# Patient Record
Sex: Male | Born: 1990 | Race: Black or African American | Hispanic: No | Marital: Single | Smoking: Never smoker
Health system: Southern US, Community
[De-identification: ages and names within clinical notes are randomized; demographics above are authoritative.]

## PROBLEM LIST (undated history)

## (undated) DIAGNOSIS — U071 COVID-19: Secondary | ICD-10-CM

---
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
## 2009-05-16 ENCOUNTER — Emergency Department (HOSPITAL_COMMUNITY): Admission: EM | Admit: 2009-05-16 | Discharge: 2009-05-16 | Payer: Self-pay | Admitting: Emergency Medicine

## 2014-03-30 ENCOUNTER — Encounter (HOSPITAL_COMMUNITY): Payer: Self-pay | Admitting: Emergency Medicine

## 2014-03-30 ENCOUNTER — Emergency Department (HOSPITAL_COMMUNITY): Payer: Self-pay

## 2014-03-30 ENCOUNTER — Emergency Department (HOSPITAL_COMMUNITY)
Admission: EM | Admit: 2014-03-30 | Discharge: 2014-03-30 | Disposition: A | Discharge status: Home / Self Care | Payer: Self-pay | Attending: Emergency Medicine | Admitting: Emergency Medicine

## 2014-03-30 DIAGNOSIS — R42 Dizziness and giddiness: Secondary | ICD-10-CM | POA: Insufficient documentation

## 2014-03-30 DIAGNOSIS — Y92410 Unspecified street and highway as the place of occurrence of the external cause: Secondary | ICD-10-CM | POA: Insufficient documentation

## 2014-03-30 DIAGNOSIS — Y9389 Activity, other specified: Secondary | ICD-10-CM | POA: Insufficient documentation

## 2014-03-30 DIAGNOSIS — S161XXA Strain of muscle, fascia and tendon at neck level, initial encounter: Secondary | ICD-10-CM | POA: Insufficient documentation

## 2014-03-30 MED ORDER — NAPROXEN 500 MG PO TABS: ORAL_TABLET | ORAL | Status: DC

## 2014-03-30 NOTE — ED Notes (Signed)
MD at bedside. 
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
## 2014-03-30 NOTE — Discharge Instructions (Signed)
Follow up with your md as needed °
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
## 2014-05-29 ENCOUNTER — Emergency Department (HOSPITAL_COMMUNITY)
Admission: EM | Admit: 2014-05-29 | Discharge: 2014-05-29 | Disposition: A | Discharge status: Home / Self Care | Payer: BC Managed Care – PPO | Attending: Emergency Medicine | Admitting: Emergency Medicine

## 2014-05-29 ENCOUNTER — Encounter (HOSPITAL_COMMUNITY): Payer: Self-pay | Admitting: Emergency Medicine

## 2014-05-29 ENCOUNTER — Emergency Department (HOSPITAL_COMMUNITY): Payer: BC Managed Care – PPO

## 2014-05-29 DIAGNOSIS — Y998 Other external cause status: Secondary | ICD-10-CM | POA: Insufficient documentation

## 2014-05-29 DIAGNOSIS — Y92410 Unspecified street and highway as the place of occurrence of the external cause: Secondary | ICD-10-CM | POA: Diagnosis not present

## 2014-05-29 DIAGNOSIS — Z791 Long term (current) use of non-steroidal anti-inflammatories (NSAID): Secondary | ICD-10-CM | POA: Diagnosis not present

## 2014-05-29 DIAGNOSIS — S52351A Displaced comminuted fracture of shaft of radius, right arm, initial encounter for closed fracture: Secondary | ICD-10-CM | POA: Insufficient documentation

## 2014-05-29 DIAGNOSIS — S52301A Unspecified fracture of shaft of right radius, initial encounter for closed fracture: Secondary | ICD-10-CM

## 2014-05-29 DIAGNOSIS — S52201A Unspecified fracture of shaft of right ulna, initial encounter for closed fracture: Secondary | ICD-10-CM

## 2014-05-29 DIAGNOSIS — S4991XA Unspecified injury of right shoulder and upper arm, initial encounter: Secondary | ICD-10-CM | POA: Diagnosis present

## 2014-05-29 DIAGNOSIS — S52251A Displaced comminuted fracture of shaft of ulna, right arm, initial encounter for closed fracture: Secondary | ICD-10-CM | POA: Diagnosis not present

## 2014-05-29 DIAGNOSIS — Y9389 Activity, other specified: Secondary | ICD-10-CM | POA: Insufficient documentation

## 2014-05-29 MED ORDER — ONDANSETRON HCL 4 MG/2ML IJ SOLN
4.0000 mg | Freq: Once | INTRAMUSCULAR | Status: AC
  Administered 2014-05-29: 4 mg via INTRAVENOUS
  Filled 2014-05-29: qty 2

## 2014-05-29 MED ORDER — OXYCODONE-ACETAMINOPHEN 5-325 MG PO TABS: 1.0000 | ORAL_TABLET | ORAL | Status: DC | PRN

## 2014-05-29 MED ORDER — HYDROMORPHONE HCL 1 MG/ML IJ SOLN
1.0000 mg | Freq: Once | INTRAMUSCULAR | Status: AC
  Administered 2014-05-29: 1 mg via INTRAVENOUS
  Filled 2014-05-29: qty 1
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
## 2014-05-29 NOTE — Discharge Instructions (Signed)
Forearm Fracture °Your caregiver has diagnosed you as having a broken bone (fracture) of the forearm. This is the part of your arm between the elbow and your wrist. Your forearm is made up of two bones. These are the radius and ulna. A fracture is a break in one or both bones. A cast or splint is used to protect and keep your injured bone from moving. The cast or splint will be on generally for about 5 to 6 weeks, with individual variations. °HOME CARE INSTRUCTIONS  °· Keep the injured part elevated while sitting or lying down. Keeping the injury above the level of your heart (the center of the chest). This will decrease swelling and pain. °· Apply ice to the injury for 15-20 minutes, 03-04 times per day while awake, for 2 days. Put the ice in a plastic bag and place a thin towel between the bag of ice and your cast or splint. °· If you have a plaster or fiberglass cast: °¨ Do not try to scratch the skin under the cast using sharp or pointed objects. °¨ Check the skin around the cast every day. You may put lotion on any red or sore areas. °¨ Keep your cast dry and clean. °· If you have a plaster splint: °¨ Wear the splint as directed. °¨ You may loosen the elastic around the splint if your fingers become numb, tingle, or turn cold or blue. °· Do not put pressure on any part of your cast or splint. It may break. Rest your cast only on a pillow the first 24 hours until it is fully hardened. °· Your cast or splint can be protected during bathing with a plastic bag. Do not lower the cast or splint into water. °· Only take over-the-counter or prescription medicines for pain, discomfort, or fever as directed by your caregiver. °SEEK IMMEDIATE MEDICAL CARE IF:  °· Your cast gets damaged or breaks. °· You have more severe pain or swelling than you did before the cast. °· Your skin or nails below the injury turn blue or gray, or feel cold or numb. °· There is a bad smell or new stains and/or pus like (purulent) drainage  coming from under the cast. °MAKE SURE YOU:  °· Understand these instructions. °· Will watch your condition. °· Will get help right away if you are not doing well or get worse. °Document Released: 06/01/2000 Document Revised: 08/27/2011 Document Reviewed: 01/22/2008 °ExitCare® Patient Information ©2015 [employer]. This information is not intended to replace advice given to you by your health care provider. Make sure you discuss any questions you have with your health care provider. ° °Cast or Splint Care °Casts and splints support injured limbs and keep bones from moving while they heal. It is important to care for your cast or splint at home.   °HOME CARE INSTRUCTIONS °· Keep the cast or splint uncovered during the drying period. It can take 24 to 48 hours to dry if it is made of plaster. A fiberglass cast will dry in less than 1 hour. °· Do not rest the cast on anything harder than a pillow for the first 24 hours. °· Do not put weight on your injured limb or apply pressure to the cast until your health care provider gives you permission. °· Keep the cast or splint dry. Wet casts or splints can lose their shape and may not support the limb as well. A wet cast that has lost its shape can also create harmful pressure on   on your skin when it dries. Also, wet skin can become infected.  Cover the cast or splint with a plastic bag when bathing or when out in the rain or snow. If the cast is on the trunk of the body, take sponge baths until the cast is removed.  If your cast does become wet, dry it with a towel or a blow dryer on the cool setting only.  Keep your cast or splint clean. Soiled casts may be wiped with a moistened cloth.  Do not place any hard or soft foreign objects under your cast or splint, such as cotton, toilet paper, lotion, or powder.  Do not try to scratch the skin under the cast with any object. The object could get stuck inside the cast. Also, scratching could lead to an infection. If  itching is a problem, use a blow dryer on a cool setting to relieve discomfort.  Do not trim or cut your cast or remove padding from inside of it.  Exercise all joints next to the injury that are not immobilized by the cast or splint. For example, if you have a long leg cast, exercise the hip joint and toes. If you have an arm cast or splint, exercise the shoulder, elbow, thumb, and fingers.  Elevate your injured arm or leg on 1 or 2 pillows for the first 1 to 3 days to decrease swelling and pain.It is best if you can comfortably elevate your cast so it is higher than your heart. SEEK MEDICAL CARE IF:   Your cast or splint cracks.  Your cast or splint is too tight or too loose.  You have unbearable itching inside the cast.  Your cast becomes wet or develops a soft spot or area.  You have a bad smell coming from inside your cast.  You get an object stuck under your cast.  Your skin around the cast becomes red or raw.  You have new pain or worsening pain after the cast has been applied. SEEK IMMEDIATE MEDICAL CARE IF:   You have fluid leaking through the cast.  You are unable to move your fingers or toes.  You have discolored (blue or white), cool, painful, or very swollen fingers or toes beyond the cast.  You have tingling or numbness around the injured area.  You have severe pain or pressure under the cast.  You have any difficulty with your breathing or have shortness of breath.  You have chest pain. Document Released: 06/01/2000 Document Revised: 03/25/2013 Document Reviewed: 12/11/2012 [facility] Patient Information 2015 [employer] [place]. This information is not intended to replace advice given to you by your health care provider. Make sure you discuss any questions you have with your health care provider.  Acetaminophen; Oxycodone tablets What is this medicine? ACETAMINOPHEN; OXYCODONE (a set a MEE noe fen; ox i KOE done) is a pain reliever. It is used to treat mild to  moderate pain. This medicine may be used for other purposes; ask your health care provider or pharmacist if you have questions. COMMON BRAND NAME(S): Endocet, Magnacet, Narvox, Percocet, Perloxx, Primalev, Primlev, Roxicet, Xolox What should I tell my health care provider before I take this medicine? They need to know if you have any of these conditions: -brain tumor -Crohn's disease, inflammatory bowel disease, or ulcerative colitis -drug abuse or addiction -head injury -heart or circulation problems -if you often drink alcohol -kidney disease or problems going to the bathroom -liver disease -lung disease, asthma, or breathing problems -an unusual  or allergic reaction to acetaminophen, oxycodone, other opioid analgesics, other medicines, foods, dyes, or preservatives -pregnant or trying to get pregnant -breast-feeding How should I use this medicine? Take this medicine by mouth with a full glass of water. Follow the directions on the prescription label. Take your medicine at regular intervals. Do not take your medicine more often than directed. Talk to your pediatrician regarding the use of this medicine in children. Special care may be needed. Patients over 25 years old may have a stronger reaction and need a smaller dose. Overdosage: If you think you have taken too much of this medicine contact a poison control center or emergency room at once. NOTE: This medicine is only for you. Do not share this medicine with others. What if I miss a dose? If you miss a dose, take it as soon as you can. If it is almost time for your next dose, take only that dose. Do not take double or extra doses. What may interact with this medicine? -alcohol -antihistamines -barbiturates like amobarbital, butalbital, butabarbital, methohexital, pentobarbital, phenobarbital, thiopental, and secobarbital -benztropine -drugs for bladder problems like solifenacin, trospium, oxybutynin, tolterodine, hyoscyamine, and  methscopolamine -drugs for breathing problems like ipratropium and tiotropium -drugs for certain stomach or intestine problems like propantheline, homatropine methylbromide, glycopyrrolate, atropine, belladonna, and dicyclomine -general anesthetics like etomidate, ketamine, nitrous oxide, propofol, desflurane, enflurane, halothane, isoflurane, and sevoflurane -medicines for depression, anxiety, or psychotic disturbances -medicines for sleep -muscle relaxants -naltrexone -narcotic medicines (opiates) for pain -phenothiazines like perphenazine, thioridazine, chlorpromazine, mesoridazine, fluphenazine, prochlorperazine, promazine, and trifluoperazine -scopolamine -tramadol -trihexyphenidyl This list may not describe all possible interactions. Give your health care provider a list of all the medicines, herbs, non-prescription drugs, or dietary supplements you use. Also tell them if you smoke, drink alcohol, or use illegal drugs. Some items may interact with your medicine. What should I watch for while using this medicine? Tell your doctor or health care professional if your pain does not go away, if it gets worse, or if you have new or a different type of pain. You may develop tolerance to the medicine. Tolerance means that you will need a higher dose of the medication for pain relief. Tolerance is normal and is expected if you take this medicine for a long time. Do not suddenly stop taking your medicine because you may develop a severe reaction. Your body becomes used to the medicine. This does NOT mean you are addicted. Addiction is a behavior related to getting and using a drug for a non-medical reason. If you have pain, you have a medical reason to take pain medicine. Your doctor will tell you how much medicine to take. If your doctor wants you to stop the medicine, the dose will be slowly lowered over time to avoid any side effects. You may get drowsy or dizzy. Do not drive, use machinery, or do  anything that needs mental alertness until you know how this medicine affects you. Do not stand or sit up quickly, especially if you are an older patient. This reduces the risk of dizzy or fainting spells. Alcohol may interfere with the effect of this medicine. Avoid alcoholic drinks. There are different types of narcotic medicines (opiates) for pain. If you take more than one type at the same time, you may have more side effects. Give your health care provider a list of all medicines you use. Your doctor will tell you how much medicine to take. Do not take more medicine than directed. Call emergency for  help if you have problems breathing. The medicine will cause constipation. Try to have a bowel movement at least every 2 to 3 days. If you do not have a bowel movement for 3 days, call your doctor or health care professional. Do not take Tylenol (acetaminophen) or medicines that have acetaminophen with this medicine. Too much acetaminophen can be very dangerous. Many nonprescription medicines contain acetaminophen. Always read the labels carefully to avoid taking more acetaminophen. What side effects may I notice from receiving this medicine? Side effects that you should report to your doctor or health care professional as soon as possible: -allergic reactions like skin rash, itching or hives, swelling of the face, lips, or tongue -breathing difficulties, wheezing -confusion -light headedness or fainting spells -severe stomach pain -unusually weak or tired -yellowing of the skin or the whites of the eyes Side effects that usually do not require medical attention (report to your doctor or health care professional if they continue or are bothersome): -dizziness -drowsiness -nausea -vomiting This list may not describe all possible side effects. Call your doctor for medical advice about side effects. You may report side effects to FDA at 1-800-FDA-1088. Where should I keep my medicine? Keep out of  the reach of children. This medicine can be abused. Keep your medicine in a safe place to protect it from theft. Do not share this medicine with anyone. Selling or giving away this medicine is dangerous and against the law. Store at room temperature between 20 and 25 degrees C (68 and 77 degrees F). Keep container tightly closed. Protect from light. This medicine may cause accidental overdose and death if it is taken by other adults, children, or pets. Flush any unused medicine down the toilet to reduce the chance of harm. Do not use the medicine after the expiration date. NOTE: This sheet is a summary. It may not cover all possible information. If you have questions about this medicine, talk to your doctor, pharmacist, or health care provider.  2015, [employer]. (2013-01-26 13:17:35)
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
## 2019-10-07 ENCOUNTER — Other Ambulatory Visit: Payer: Self-pay

## 2019-10-07 ENCOUNTER — Ambulatory Visit (INDEPENDENT_AMBULATORY_CARE_PROVIDER_SITE_OTHER)
Admission: RE | Admit: 2019-10-07 | Discharge: 2019-10-07 | Disposition: A | Discharge status: Other Acute Inpatient Hospital | Payer: Self-pay | Source: Ambulatory Visit

## 2019-10-07 DIAGNOSIS — R197 Diarrhea, unspecified: Secondary | ICD-10-CM

## 2019-10-07 NOTE — Discharge Instructions (Addendum)
Come to the Urgent Care for in-person evaluation of your symptoms.     

## 2019-10-07 NOTE — ED Provider Notes (Signed)
Virtual Visit via Video Note:  Robert Boyd  initiated request for Telemedicine visit with [facility] team. I connected with Robert Boyd  on 10/07/2019 at 1:17 PM  for a synchronized telemedicine visit using a video enabled HIPPA compliant telemedicine application. I verified that I am speaking with Robert Boyd  using two identifiers. Mickie Bail, NP  was physically located in a [facility] Urgent care site and Robert Boyd was located at a different location.   The limitations of evaluation and management by telemedicine as well as the availability of in-person appointments were discussed. Patient was informed that he  may incur a bill ( including co-pay) for this virtual visit encounter. Robert Boyd  expressed understanding and gave verbal consent to proceed with virtual visit.     History of Present Illness:Robert Boyd  is a 29 y.o. male presents for evaluation of diarrhea, abdominal pain, headache, fever x 3 days.  Tmax 100 on 10/05/2019.  Two episodes of diarrhea today.  He had a PCR COVID test on 10/05/19; it was negative.  He denies vomiting, sore throat, cough, shortness of breath, rash, or other symptoms.  Treatment at home with Tylenol.     No Known Allergies   No past medical history on file.   Social History   Tobacco Use  . Smoking status: Never Smoker  Substance Use Topics  . Alcohol use: Yes    Comment: occassionally  . Drug use: No   ROS: as stated in HPI.  All other systems reviewed and negative.      Observations/Objective: Physical Exam  VITALS: Patient denies fever. GENERAL: Alert, appears well and in no acute distress. HEENT: Atraumatic. NECK: Normal movements of the head and neck. CARDIOPULMONARY: No increased WOB. Speaking in clear sentences. I:E ratio WNL.  MS: Moves all visible extremities without noticeable abnormality. PSYCH: Pleasant and cooperative, well-groomed. Speech normal rate and rhythm. Affect is appropriate. Insight  and judgement are appropriate. Attention is focused, linear, and appropriate.  NEURO: CN grossly intact. Oriented as arrived to appointment on time with no prompting. Moves both UE equally.  SKIN: No obvious lesions, wounds, erythema, or cyanosis noted on face or hands.   Assessment and Plan:    ICD-10-CM   1. Diarrhea, unspecified type  R19.7        Follow Up Instructions: Advised patient to come for in person evaluation due to his symptoms of abdominal pain with fever and diarrhea.  Patient agrees to plan of care.    I discussed the assessment and treatment plan with the patient. The patient was provided an opportunity to ask questions and all were answered. The patient agreed with the plan and demonstrated an understanding of the instructions.   The patient was advised to call back or seek an in-person evaluation if the symptoms worsen or if the condition fails to improve as anticipated.      Mickie Bail, NP  10/07/2019 1:17 PM         Mickie Bail, NP 10/07/19 1317

## 2019-11-24 ENCOUNTER — Emergency Department (HOSPITAL_COMMUNITY): Admission: EM | Admit: 2019-11-24 | Discharge: 2019-11-24 | Discharge status: Against Medical Advice | Payer: Self-pay

## 2019-11-24 ENCOUNTER — Other Ambulatory Visit: Payer: Self-pay

## 2019-11-24 NOTE — ED Notes (Signed)
Pt called x3 for triage no answer in the lobby.

## 2019-11-25 ENCOUNTER — Emergency Department (HOSPITAL_COMMUNITY): Payer: Self-pay

## 2019-11-25 ENCOUNTER — Emergency Department (HOSPITAL_COMMUNITY)
Admission: EM | Admit: 2019-11-25 | Discharge: 2019-11-25 | Disposition: A | Discharge status: Home / Self Care | Payer: Self-pay | Attending: Emergency Medicine | Admitting: Emergency Medicine

## 2019-11-25 ENCOUNTER — Other Ambulatory Visit: Payer: Self-pay

## 2019-11-25 ENCOUNTER — Encounter (HOSPITAL_COMMUNITY): Payer: Self-pay | Admitting: Emergency Medicine

## 2019-11-25 ENCOUNTER — Ambulatory Visit
Admission: EM | Admit: 2019-11-25 | Discharge: 2019-11-25 | Disposition: A | Discharge status: Home / Self Care | Payer: Self-pay | Attending: Emergency Medicine | Admitting: Emergency Medicine

## 2019-11-25 DIAGNOSIS — Z5321 Procedure and treatment not carried out due to patient leaving prior to being seen by health care provider: Secondary | ICD-10-CM | POA: Insufficient documentation

## 2019-11-25 DIAGNOSIS — R0789 Other chest pain: Secondary | ICD-10-CM

## 2019-11-25 LAB — CBC
HCT: 46.2 % (ref 39.0–52.0)
Hemoglobin: 15.5 g/dL (ref 13.0–17.0)
MCH: 29 pg (ref 26.0–34.0)
MCHC: 33.5 g/dL (ref 30.0–36.0)
MCV: 86.5 fL (ref 80.0–100.0)
Platelets: 303 10*3/uL (ref 150–400)
RBC: 5.34 MIL/uL (ref 4.22–5.81)
RDW: 11.7 % (ref 11.5–15.5)
WBC: 9.9 10*3/uL (ref 4.0–10.5)
nRBC: 0 % (ref 0.0–0.2)

## 2019-11-25 LAB — BASIC METABOLIC PANEL
Anion gap: 9 (ref 5–15)
BUN: 12 mg/dL (ref 6–20)
CO2: 26 mmol/L (ref 22–32)
Calcium: 9.2 mg/dL (ref 8.9–10.3)
Chloride: 104 mmol/L (ref 98–111)
Creatinine, Ser: 0.65 mg/dL (ref 0.61–1.24)
GFR calc Af Amer: >60 mL/min (ref >60)
GFR calc non Af Amer: >60 mL/min (ref >60)
Glucose, Bld: 183 mg/dL — ABNORMAL HIGH (ref 70–99)
Potassium: 3.9 mmol/L (ref 3.5–5.1)
Sodium: 139 mmol/L (ref 135–145)

## 2019-11-25 LAB — TROPONIN I (HIGH SENSITIVITY)
Troponin I (High Sensitivity): <2 ng/L (ref <18)
Troponin I (High Sensitivity): <2 ng/L (ref <18)

## 2019-11-25 NOTE — Discharge Instructions (Addendum)
Chest pain precautions given. To go to ED for further evaluation

## 2019-11-25 NOTE — ED Notes (Addendum)
Patient is being discharged from the Urgent Care and sent to the Emergency Department via POV . patient is in need of higher level of care due to chest pain per Nena Jordan, NP. Patient is aware and verbalizes understanding of plan of care.  Vitals:   11/25/19 1252  BP: 136/86  Pulse: 86  Resp: 18  Temp: 98.7 F (37.1 C)  SpO2: 99%

## 2019-11-25 NOTE — ED Provider Notes (Signed)
[place]   [id number] 11/25/19 Arrival Time: 9509   CC: CHEST PAIN  SUBJECTIVE:  Robert Boyd is a 29 y.o. male who presents with complaint of chest pain radiating to left arm for the past 5 days.  Denies a precipitating event, trauma, recent lower respiratory tract, or strenuous upper body activities.  Localizes chest pain to the left side of his chest.  Describes as  constant, throbbing and  pressure in character.  Rates pain as 7/10.  Has not tried any OTC medication.  Symptoms made worse with activity/ rest.  Denies radiates symptoms.  Denies previous symptoms in the past.  Denies fever, chills, lightheadedness, dizziness, palpitations, tachycardia, nausea, vomiting, abdominal pain, changes in bowel or bladder habits, diaphoresis, numbness/tingling in extremities, peripheral edema, or anxiety.     Denies close relatives with cardiac hx  Previous cardiac testing: none.  ROS: As per HPI.  All other pertinent ROS negative.    No past medical history on file. No past surgical history on file. No Known Allergies No current facility-administered medications on file prior to encounter.   No current outpatient medications on file prior to encounter.   Social History   Socioeconomic History   Marital status: Single    Spouse name: Not on file   Number of children: Not on file   Years of education: Not on file   Highest education level: Not on file  Occupational History   Not on file  Tobacco Use   Smoking status: Never Smoker  Substance and Sexual Activity   Alcohol use: Yes    Comment: occassionally   Drug use: No   Sexual activity: Not on file  Other Topics Concern   Not on file  Social History Narrative   Not on file   Social Determinants of Health   Financial Resource Strain:    Difficulty of Paying Living Expenses:   Food Insecurity:    Worried About [place] in the Last Year:    Arboriculturist in the Last Year:     Transportation Needs:    Film/video editor (Medical):    Lack of Transportation (Non-Medical):   Physical Activity:    Days of Exercise per Week:    Minutes of Exercise per Session:   Stress:    Feeling of Stress :   Social Connections:    Frequency of Communication with Friends and Family:    Frequency of Social Gatherings with Friends and Family:    Attends Religious Services:    Active Member of Clubs or Organizations:    Attends Music therapist:    Marital Status:   Intimate Partner Violence:    Fear of Current or Ex-Partner:    Emotionally Abused:    Physically Abused:    Sexually Abused:    No family history on file.   OBJECTIVE:  Vitals:   11/25/19 1252  BP: 136/86  Pulse: 86  Resp: 18  Temp: 98.7 F (37.1 C)  SpO2: 99%    General appearance: alert; no distress Eyes: PERRLA; EOMI; conjunctiva normal HENT: normocephalic; atraumatic Neck: supple Lungs: clear to auscultation bilaterally without adventitious breath sounds Heart: regular rate and rhythm.  Clear S1 and S2 without rubs, gallops, or murmur. Chest Wall: Tenderness to palpation, no thrills Abdomen: soft, non-tender; bowel sounds normal; no masses or organomegaly; no guarding or rebound tenderness Extremities: no cyanosis or edema; symmetrical with no gross deformities Skin: warm and dry Psychological: alert and  cooperative; normal mood and affect  ECG: Orders placed or performed during the hospital encounter of 11/25/19   ED EKG   ED EKG    EKG normal sinus rhythm without ST elevations.  No narrowing or widening of the QRS complexes.  Reviewed EKG with Dr.  Kristin Bruins.  LABS:  No results found for this or any previous visit. Labs Reviewed - No data to display  DIAGNOSTIC STUDIES:  No results found.   ASSESSMENT & PLAN:  1. Other chest pain     No orders of the defined types were placed in this encounter.  In my opinion patient physical exam and  history consistent with noncardiac cause of chest pain.  He was given the option to go to ED for further evaluation.  He stated will go to the ED and declined going by ambulance   Discharge instructions Chest pain precautions given. To go to ED for further evaluation Reviewed expectations re: course of current medical issues. Questions answered. Outlined signs and symptoms indicating need for more acute intervention. Patient verbalized understanding. After Visit Summary given.   Durward Parcel, FNP 11/25/19 1324

## 2019-11-25 NOTE — ED Triage Notes (Signed)
Patient having central chest pain, radiating down left arm, left flank, shortness of breath, 3-4 days.

## 2019-11-25 NOTE — ED Triage Notes (Signed)
Pt c/o L sided chest pain since Friday, with pain going down L arm since last night

## 2019-11-26 ENCOUNTER — Encounter (HOSPITAL_COMMUNITY): Payer: Self-pay | Admitting: Emergency Medicine

## 2019-11-26 ENCOUNTER — Emergency Department (HOSPITAL_COMMUNITY)
Admission: EM | Admit: 2019-11-26 | Discharge: 2019-11-26 | Disposition: A | Discharge status: Home / Self Care | Payer: Self-pay | Attending: Emergency Medicine | Admitting: Emergency Medicine

## 2019-11-26 ENCOUNTER — Other Ambulatory Visit: Payer: Self-pay

## 2019-11-26 DIAGNOSIS — R0789 Other chest pain: Secondary | ICD-10-CM | POA: Insufficient documentation

## 2019-11-26 DIAGNOSIS — Z5321 Procedure and treatment not carried out due to patient leaving prior to being seen by health care provider: Secondary | ICD-10-CM | POA: Insufficient documentation

## 2019-11-26 NOTE — ED Notes (Signed)
Pt brought to down to ED by [facility] nurse stated that he was found at the front entrance and pt stated he was having chest pain and pt was diaphoretic. Triage RN notified.

## 2019-11-26 NOTE — ED Triage Notes (Signed)
Pt c/o L sided CP onset 5 days ago, +nausea, +shob

## 2019-11-26 NOTE — ED Notes (Signed)
Pt LWBS. Pt stated he would call his doctor tomorrow.

## 2020-06-28 ENCOUNTER — Encounter: Payer: Self-pay | Admitting: Emergency Medicine

## 2020-06-28 ENCOUNTER — Ambulatory Visit
Admission: EM | Admit: 2020-06-28 | Discharge: 2020-06-28 | Disposition: A | Discharge status: Home / Self Care | Payer: Self-pay

## 2020-06-28 ENCOUNTER — Other Ambulatory Visit: Payer: Self-pay

## 2020-06-28 DIAGNOSIS — Z0279 Encounter for issue of other medical certificate: Secondary | ICD-10-CM

## 2020-06-28 HISTORY — DX: COVID-19: U07.1

## 2020-06-28 NOTE — ED Provider Notes (Signed)
[facility]   [id number] 06/28/20 Arrival Time: 1854   CC: Work note  SUBJECTIVE: History from: patient.  Robert Boyd is a 30 y.o. male who presented to the urgent care with a complaint that he needed a work note to return to work.  Stated he tested positive for COVID-19 on 06/13/2020.  Has quarantined more than 10 days.  All his symptom has not been resolved.  Denies sick exposure to COVID, flu or strep.  Denies recent travel.  Denies alleviating or aggravating factors.  Denies previous symptoms in the past.   Denies fever, chills, fatigue, sinus pain, rhinorrhea, sore throat, SOB, wheezing, chest pain, nausea, changes in bowel or bladder habits.     ROS: As per HPI.  All other pertinent ROS negative.     Past Medical History:  Diagnosis Date  . COVID    History reviewed. No pertinent surgical history. No Known Allergies No current facility-administered medications on file prior to encounter.   No current outpatient medications on file prior to encounter.   Social History   Socioeconomic History  . Marital status: Single    Spouse name: Not on file  . Number of children: Not on file  . Years of education: Not on file  . Highest education level: Not on file  Occupational History  . Not on file  Tobacco Use  . Smoking status: Never Smoker  . Smokeless tobacco: Never Used  Substance and Sexual Activity  . Alcohol use: Yes    Comment: occassionally  . Drug use: No  . Sexual activity: Not on file  Other Topics Concern  . Not on file  Social History Narrative  . Not on file   Social Determinants of Health   Financial Resource Strain: Not on file  Food Insecurity: Not on file  Transportation Needs: Not on file  Physical Activity: Not on file  Stress: Not on file  Social Connections: Not on file  Intimate Partner Violence: Not on file   History reviewed. No pertinent family history.  OBJECTIVE:  Vitals:   06/28/20 1925 06/28/20 1928  BP:  122/86   Pulse:  95  Resp:  20  Temp:  98.6 F (37 C)  TempSrc:  Oral  SpO2:  96%  Weight: 240 lb (108.9 kg)   Height: 5\' 9"  (1.753 m)      General appearance: alert; appears fatigued, but nontoxic; speaking in full sentences and tolerating own secretions HEENT: NCAT; Ears: EACs clear, TMs pearly gray; Eyes: PERRL.  EOM grossly intact. Sinuses: nontender; Nose: nares patent without rhinorrhea, Throat: oropharynx clear, tonsils non erythematous or enlarged, uvula midline  Neck: supple without LAD Lungs: unlabored respirations, symmetrical air entry; cough: moderate; no respiratory distress; CTAB Heart: regular rate and rhythm.  Radial pulses 2+ symmetrical bilaterally Skin: warm and dry Psychological: alert and cooperative; normal mood and affect  LABS:  No results found for this or any previous visit (from the past 24 hour(s)).   ASSESSMENT & PLAN:  1. Encounter for issue of other medical certificate     No orders of the defined types were placed in this encounter.   Discharge instructions  Work note was given Use medications daily for symptom relief Use OTC medications like ibuprofen or tylenol as needed fever or pain Call or go to the ED if you have any new or worsening symptoms such as fever, worsening cough, shortness of breath, chest tightness, chest pain, turning blue, changes in mental status, etc..  Reviewed expectations re: course of current medical issues. Questions answered. Outlined signs and symptoms indicating need for more acute intervention. Patient verbalized understanding. After Visit Summary given.         Durward Parcel, FNP 06/28/20 1952

## 2020-06-28 NOTE — Discharge Instructions (Signed)
Work note was given Use medications daily for symptom relief Use OTC medications like ibuprofen or tylenol as needed fever or pain Call or go to the ED if you have any new or worsening symptoms such as fever, worsening cough, shortness of breath, chest tightness, chest pain, turning blue, changes in mental status, etc.

## 2020-06-28 NOTE — ED Triage Notes (Signed)
Pt was out of work for last two weeks test positive on 07/15/19, was released to work 06/24/19 but wasn't feeling well, now requests work note to return to work 06/29/20.

## 2021-12-29 IMAGING — DX DG CHEST 2V
2 series · 2 of 2 positions shown · non-contrast
Comparison: 03/30/2014

CLINICAL DATA: Central chest pain radiating to left arm, shortness
of breath

EXAM:
CHEST - 2 VIEW

[chest pa]
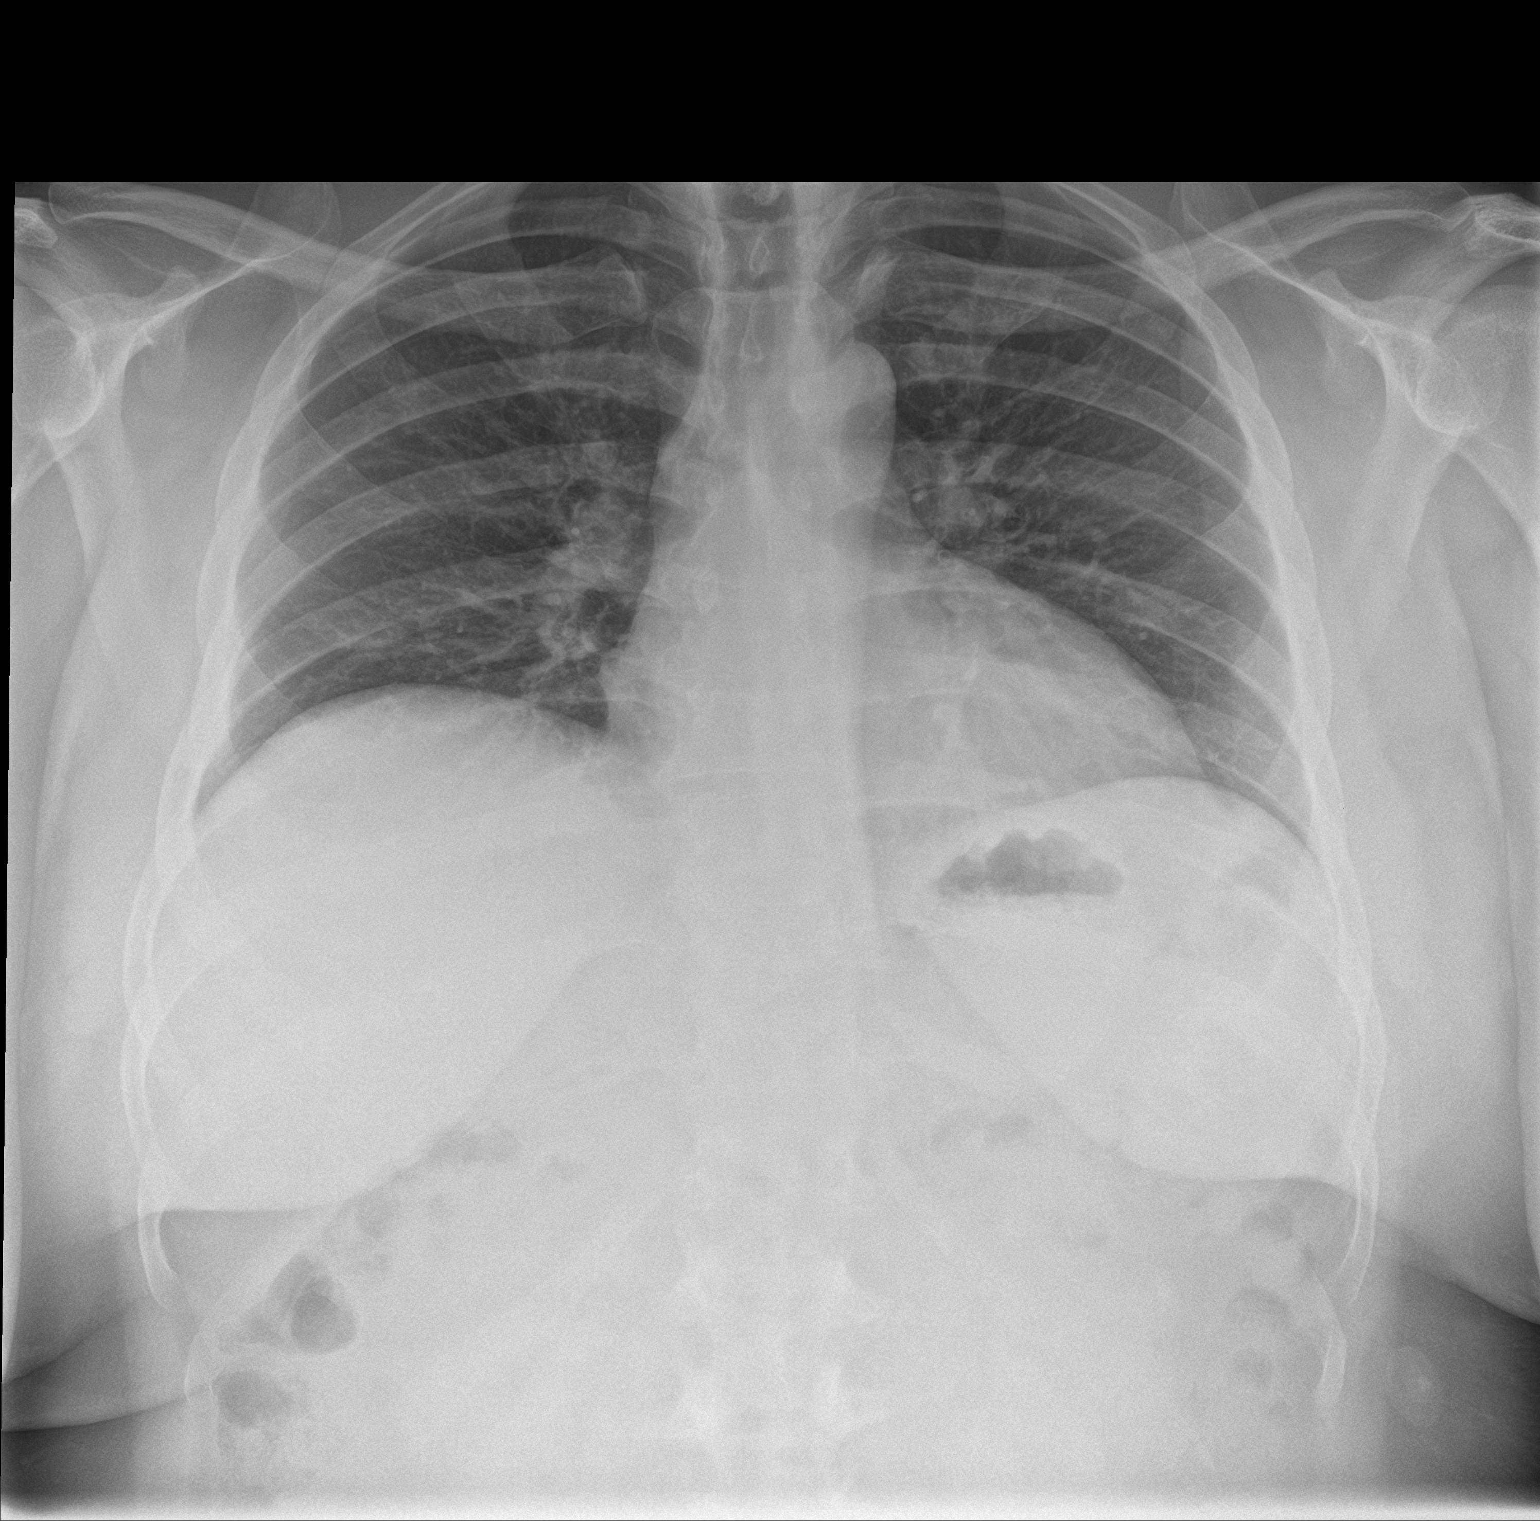

[chest lat]
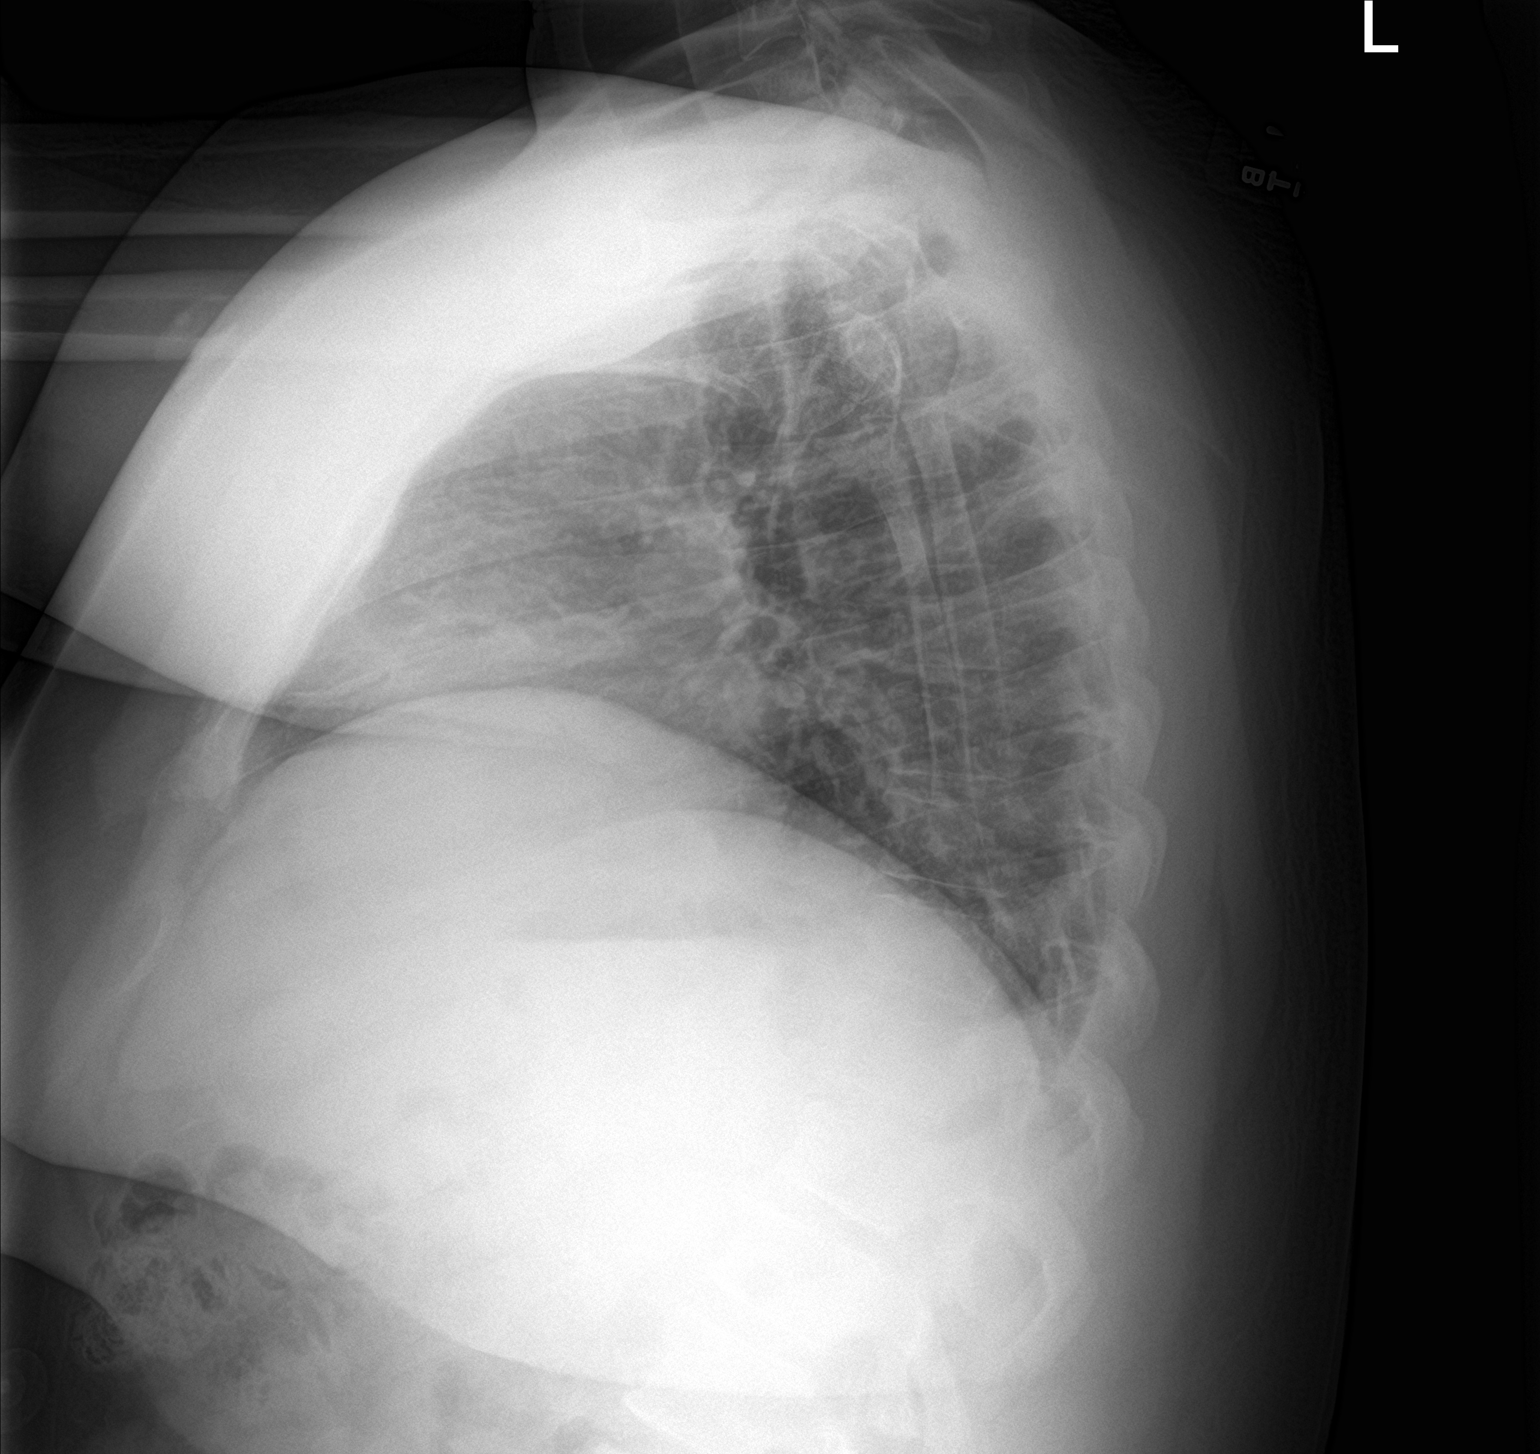

[2 of 2 positions shown; findings below may reference images not displayed]

FINDINGS: The heart size and mediastinal contours are within normal limits.
Both lungs are clear. The visualized skeletal structures are
unremarkable.
IMPRESSION: No active cardiopulmonary disease.
